# Patient Record
Sex: Male | Born: 2000 | Race: White | Hispanic: No | Marital: Single | Smoking: Never smoker
Health system: Southern US, Community
[De-identification: ages and names within clinical notes are randomized; demographics above are authoritative.]

---
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
## 2018-06-13 ENCOUNTER — Encounter: Payer: Self-pay | Admitting: Emergency Medicine

## 2018-06-13 ENCOUNTER — Emergency Department (INDEPENDENT_AMBULATORY_CARE_PROVIDER_SITE_OTHER)
Admission: EM | Admit: 2018-06-13 | Discharge: 2018-06-13 | Disposition: A | Discharge status: Home / Self Care | Payer: Worker's Compensation | Source: Home / Self Care | Attending: Family Medicine | Admitting: Family Medicine

## 2018-06-13 ENCOUNTER — Other Ambulatory Visit: Payer: Self-pay

## 2018-06-13 DIAGNOSIS — W268XXA Contact with other sharp object(s), not elsewhere classified, initial encounter: Secondary | ICD-10-CM | POA: Diagnosis not present

## 2018-06-13 DIAGNOSIS — S91012A Laceration without foreign body, left ankle, initial encounter: Secondary | ICD-10-CM

## 2018-06-13 MED ORDER — TETANUS-DIPHTH-ACELL PERTUSSIS 5-2.5-18.5 LF-MCG/0.5 IM SUSP: 0.5000 mL | Freq: Once | INTRAMUSCULAR | Status: DC

## 2018-06-13 NOTE — ED Provider Notes (Signed)
[facility]    CSN: [id number] Arrival date & time: 06/13/18  1741     History   Chief Complaint Chief Complaint  Patient presents with  . Laceration    HPI [place] V Ramaswamy is a 18 y.o. male.   HPI  [place] V Cardarelli is a 18 y.o. male presenting to UC for workers comp injury; with c/o laceration to his Left ankle just PTA. Pt cut it on a metal piece of a rolling cart.  Bleeding controlled PTA.  Pain is 1/10.  Last Tdap about 8 years ago.  No other injuries.          History reviewed. No pertinent past medical history.  There are no active problems to display for this patient.   History reviewed. No pertinent surgical history.     Home Medications    Prior to Admission medications   Not on File    Family History Family History  Problem Relation Age of Onset  . Cancer Mother   . Hypertension Mother   . Healthy Father     Social History Social History   Tobacco Use  . Smoking status: Never Smoker  . Smokeless tobacco: Never Used  Substance Use Topics  . Alcohol use: Never    Frequency: Never  . Drug use: Never     Allergies   Patient has no known allergies.   Review of Systems Review of Systems  Musculoskeletal: Negative for arthralgias and joint swelling.  Skin: Positive for wound. Negative for color change.     Physical Exam Triage Vital Signs ED Triage Vitals  Enc Vitals Group     BP 06/13/18 1811 126/74     Pulse Rate 06/13/18 1811 93     Resp --      Temp 06/13/18 1811 97.7 F (36.5 C)     Temp Source 06/13/18 1811 Oral     SpO2 06/13/18 1811 99 %     Weight 06/13/18 1812 155 lb (70.3 kg)     Height 06/13/18 1812 6' (1.829 m)     Head Circumference --      Peak Flow --      Pain Score 06/13/18 1812 3     Pain Loc --      Pain Edu? --      Excl. in GC? --    No data found.  Updated Vital Signs BP 126/74 (BP Location: Right Arm)   Pulse 93   Temp 97.7 F (36.5 C) (Oral)   Ht 6' (1.829 m)   Wt  155 lb (70.3 kg)   SpO2 99%   BMI 21.02 kg/m   Visual Acuity Right Eye Distance:   Left Eye Distance:   Bilateral Distance:    Right Eye Near:   Left Eye Near:    Bilateral Near:     Physical Exam Vitals signs and nursing note reviewed.  Constitutional:      Appearance: He is well-developed.  HENT:     Head: Normocephalic and atraumatic.  Neck:     Musculoskeletal: Normal range of motion.  Cardiovascular:     Rate and Rhythm: Normal rate.  Pulmonary:     Effort: Pulmonary effort is normal.  Musculoskeletal: Normal range of motion.        General: No swelling or tenderness.     Comments: Left ankle: full ROM  Skin:    General: Skin is warm and dry.     Findings: Laceration ( Left lateral ankle- 2cm)  present.  Neurological:     Mental Status: He is alert and oriented to person, place, and time.  Psychiatric:        Behavior: Behavior normal.      UC Treatments / Results  Labs (all labs ordered are listed, but only abnormal results are displayed) Labs Reviewed - No data to display  EKG None  Radiology No results found.  Procedure-- PERFORMED BY HAYLEYSWATZEL, PA-Student under direct supervision of Waylan RocherErin Takyra Cantrall, PA-C  Laceration Repair Date/Time: 06/14/2018 10:32 AM Performed by: Lurene ShadowPhelps, Deeandra Jerry O, PA-C Authorized by: Lattie HawBeese, Stephen A, MD   Consent:    Consent obtained:  Verbal   Consent given by:  Patient and parent   Risks discussed:  Infection and pain   Alternatives discussed:  No treatment Anesthesia (see MAR for exact dosages):    Anesthesia method:  Local infiltration   Local anesthetic:  Lidocaine 2% WITH epi Laceration details:    Location:  Foot   Foot location:  L ankle   Length (cm):  2   Depth (mm):  2 Repair type:    Repair type:  Simple Pre-procedure details:    Preparation:  Patient was prepped and draped in usual sterile fashion Exploration:    Hemostasis achieved with:  Direct pressure   Wound exploration: wound explored through  full range of motion and entire depth of wound probed and visualized     Wound extent: no areolar tissue violation noted, no fascia violation noted, no foreign bodies/material noted, no muscle damage noted, no nerve damage noted, no tendon damage noted, no underlying fracture noted and no vascular damage noted     Contaminated: no   Treatment:    Area cleansed with:  Saline   Amount of cleaning:  Standard Skin repair:    Repair method:  Sutures   Suture size:  4-0   Suture material:  Prolene   Suture technique:  Simple interrupted   Number of sutures:  2 Approximation:    Approximation:  Close Post-procedure details:    Dressing:  Antibiotic ointment and adhesive bandage   Patient tolerance of procedure:  Tolerated well, no immediate complications   (including critical care time)  Medications Ordered in UC Medications - No data to display  Initial Impression / Assessment and Plan / UC Course  I have reviewed the triage vital signs and the nursing notes.  Pertinent labs & imaging results that were available during my care of the patient were reviewed by me and considered in my medical decision making (see chart for details).     Simple laceration occurred at work. Tdap updated Laceration repaired as noted above Home care instructions provided.  Final Clinical Impressions(s) / UC Diagnoses   Final diagnoses:  Laceration of left ankle, initial encounter     Discharge Instructions      Keep wound covered for 24 hours unless bandage gets dirty or wet. After 24 hours, gently remove the bandage and clean with a mild soap and warm water, pat dry.  You may apply an antibiotic ointment such as neosporin the first 4-5 days.  Keep covered with a bandage to keep protected and clean.  Return to Employee[facility]upatinal Health (Monday-Friday 8-5pm[facility]  for suture removal in 10-14 days. Return sooner if concern for infection- increased pain, redness, swelling,  drainage.     ED Prescriptions    None     Controlled Substance Prescriptions [place] Controlled Substance Registry consulted? Not Applicable   Lurene ShadowPhelps, Wilberto Console O, PA-C  06/14/18 1157  

## 2018-06-13 NOTE — Discharge Instructions (Signed)
°  Keep wound covered for 24 hours unless bandage gets dirty or wet. After 24 hours, gently remove the bandage and clean with a mild soap and warm water, pat dry.  You may apply an antibiotic ointment such as neosporin the first 4-5 days.  Keep covered with a bandage to keep protected and clean.  Return to Employee/Occupatinal Health (Monday-Friday 8-5pm) at [facility] for suture removal in 10-14 days. Return sooner if concern for infection- increased pain, redness, swelling, drainage.

## 2018-06-13 NOTE — ED Triage Notes (Signed)
Laceration left ankle today cut on a cart he was pushing at work today.

## 2018-06-14 DIAGNOSIS — S91012A Laceration without foreign body, left ankle, initial encounter: Secondary | ICD-10-CM | POA: Diagnosis not present

## 2018-06-19 ENCOUNTER — Ambulatory Visit (INDEPENDENT_AMBULATORY_CARE_PROVIDER_SITE_OTHER): Payer: Worker's Compensation

## 2018-06-19 ENCOUNTER — Other Ambulatory Visit: Payer: Self-pay | Admitting: Gerontology

## 2018-06-19 DIAGNOSIS — M7989 Other specified soft tissue disorders: Secondary | ICD-10-CM

## 2018-06-19 DIAGNOSIS — R609 Edema, unspecified: Secondary | ICD-10-CM

## 2019-04-19 IMAGING — DX DG ANKLE COMPLETE 3+V*L*
3 series · 3 of 3 positions shown · non-contrast
Comparison: None.

CLINICAL DATA: Blunt trauma several days ago with persistent pain
and swelling, initial encounter

EXAM:
LEFT ANKLE COMPLETE - 3+ VIEW

[ankle ap]
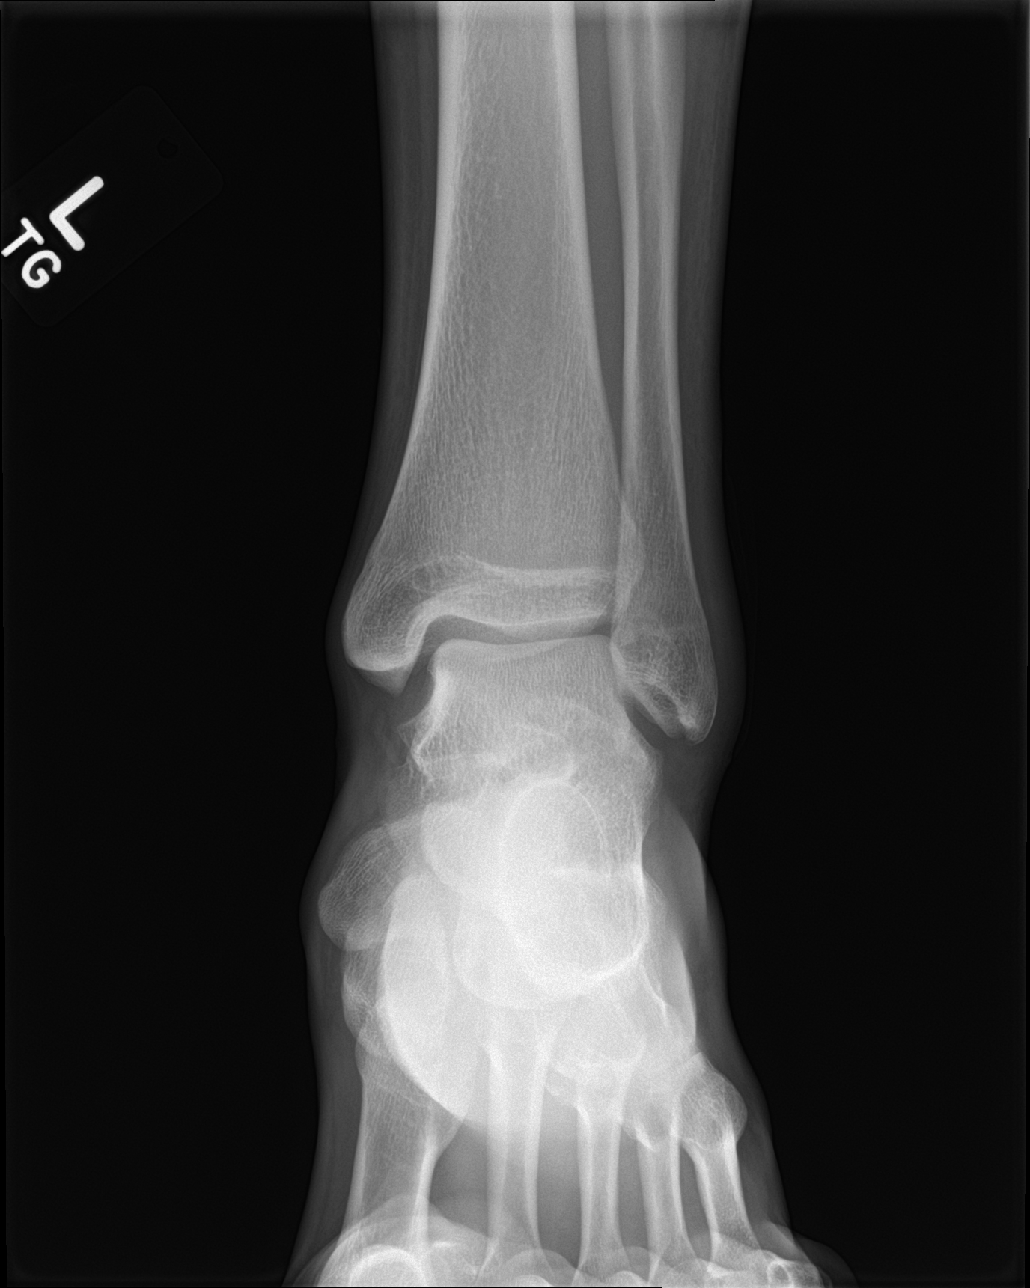

[ankle obl]
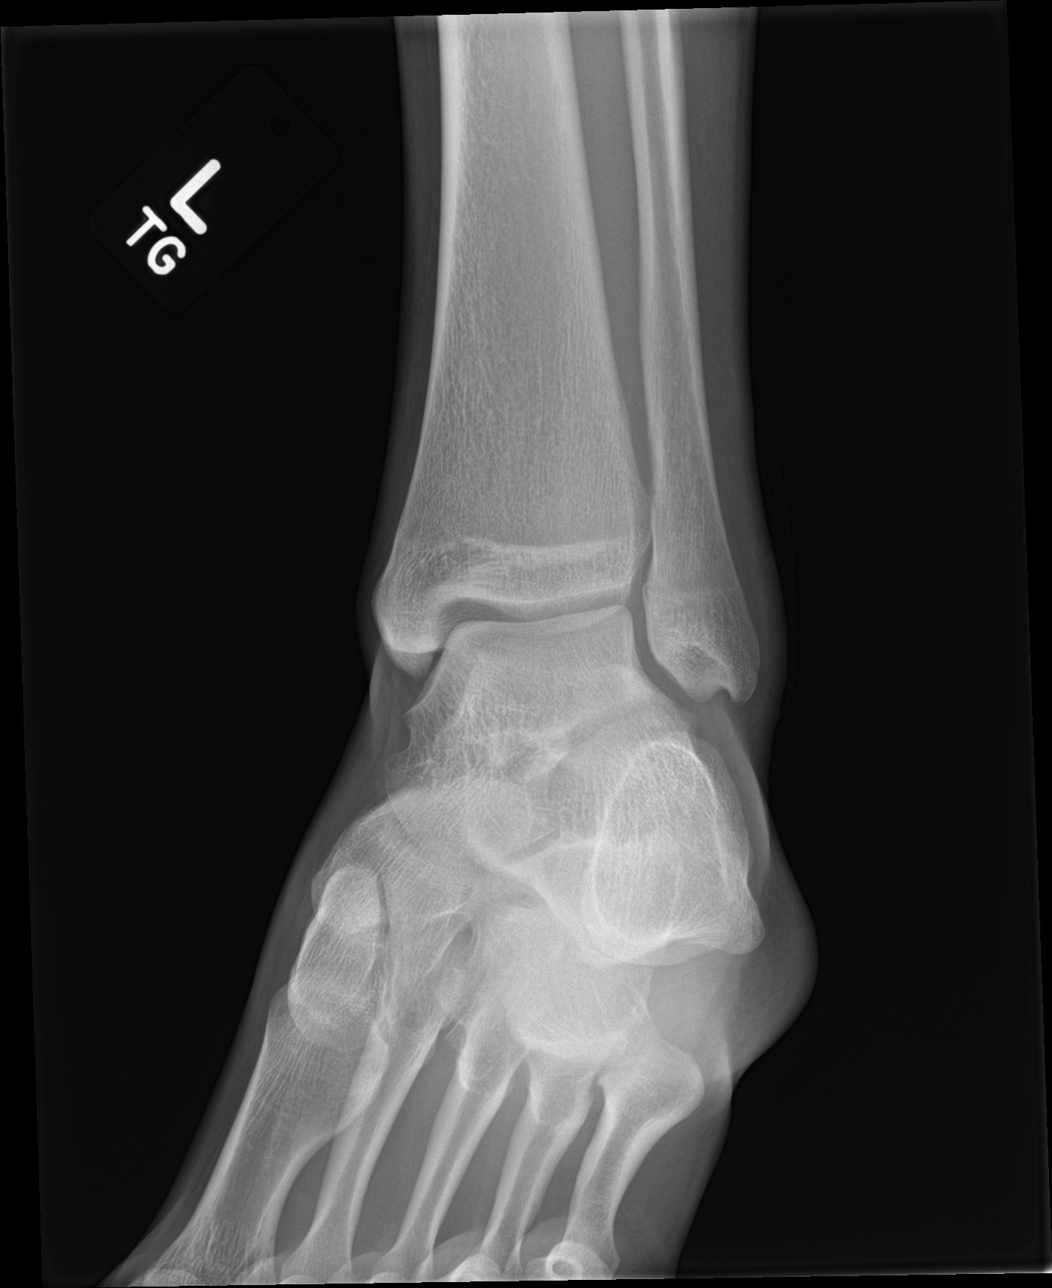

[ankle lat]
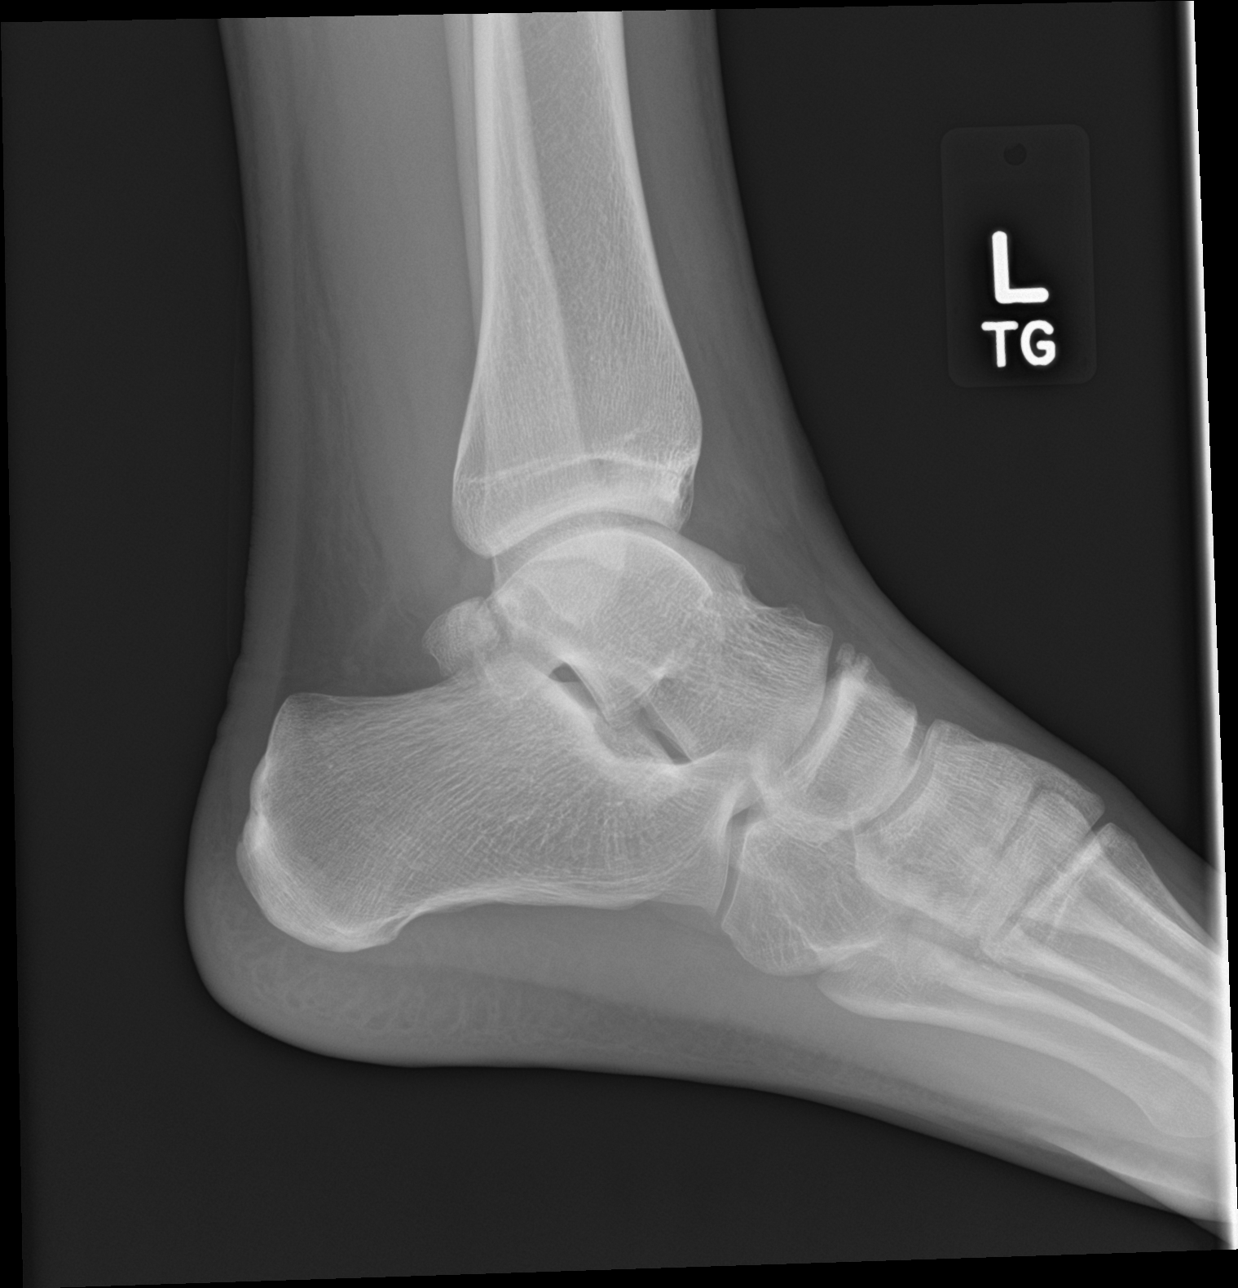

[3 of 3 positions shown; findings below may reference images not displayed]

FINDINGS: Mild soft tissue swelling is noted. No definitive fracture is seen.
No dislocation is noted.
IMPRESSION: Mild soft tissue swelling without acute bony abnormality.
